# Patient Record
Sex: Female | Born: 1970 | Race: White | Hispanic: No | Smoking: Never smoker
Health system: Southern US, Community
[De-identification: ages and names within clinical notes are randomized; demographics above are authoritative.]

## PROBLEM LIST (undated history)

## (undated) DIAGNOSIS — D6851 Activated protein C resistance: Secondary | ICD-10-CM

## (undated) DIAGNOSIS — N289 Disorder of kidney and ureter, unspecified: Secondary | ICD-10-CM

## (undated) HISTORY — PX: LITHOTRIPSY: SUR834

---

## 2004-01-15 ENCOUNTER — Ambulatory Visit (HOSPITAL_COMMUNITY): Admission: RE | Admit: 2004-01-15 | Discharge: 2004-01-15 | Payer: Self-pay | Admitting: Emergency Medicine

## 2004-01-15 ENCOUNTER — Inpatient Hospital Stay (HOSPITAL_COMMUNITY): Admission: EM | Admit: 2004-01-15 | Discharge: 2004-01-17 | Payer: Self-pay | Admitting: Emergency Medicine

## 2004-01-20 ENCOUNTER — Encounter: Admission: RE | Admit: 2004-01-20 | Discharge: 2004-01-20 | Payer: Self-pay | Admitting: Family Medicine

## 2004-01-31 ENCOUNTER — Inpatient Hospital Stay (HOSPITAL_COMMUNITY): Admission: EM | Admit: 2004-01-31 | Discharge: 2004-02-07 | Payer: Self-pay | Admitting: Emergency Medicine

## 2004-04-24 ENCOUNTER — Encounter
Admission: RE | Admit: 2004-04-24 | Discharge: 2004-04-24 | Payer: Self-pay | Admitting: Thoracic Surgery (Cardiothoracic Vascular Surgery)

## 2004-08-21 ENCOUNTER — Ambulatory Visit: Payer: Self-pay | Admitting: Cardiology

## 2005-06-07 IMAGING — CR DG CHEST 2V
2 series · 2 of 2 positions shown · non-contrast
Comparison: none

CLINICAL DATA: Acute pericardial effusion.  Chest pain.   Preoperative respiratory exam. 
 TWO VIEW CHEST   
 Moderate cardiac enlargement is seen.  Small pleural effusions are present bilaterally as well as mild bibasilar atelectasis.  There is no evidence of pulmonary edema.  There is no evidence of hilar or mediastinal mass.
 IMPRESSION
 1.  Moderate cardiac enlargement. 
 2.  Small bilateral pleural effusions.
 3.  Mild bibasilar atelectasis.

[view not recorded (1 of 2)]
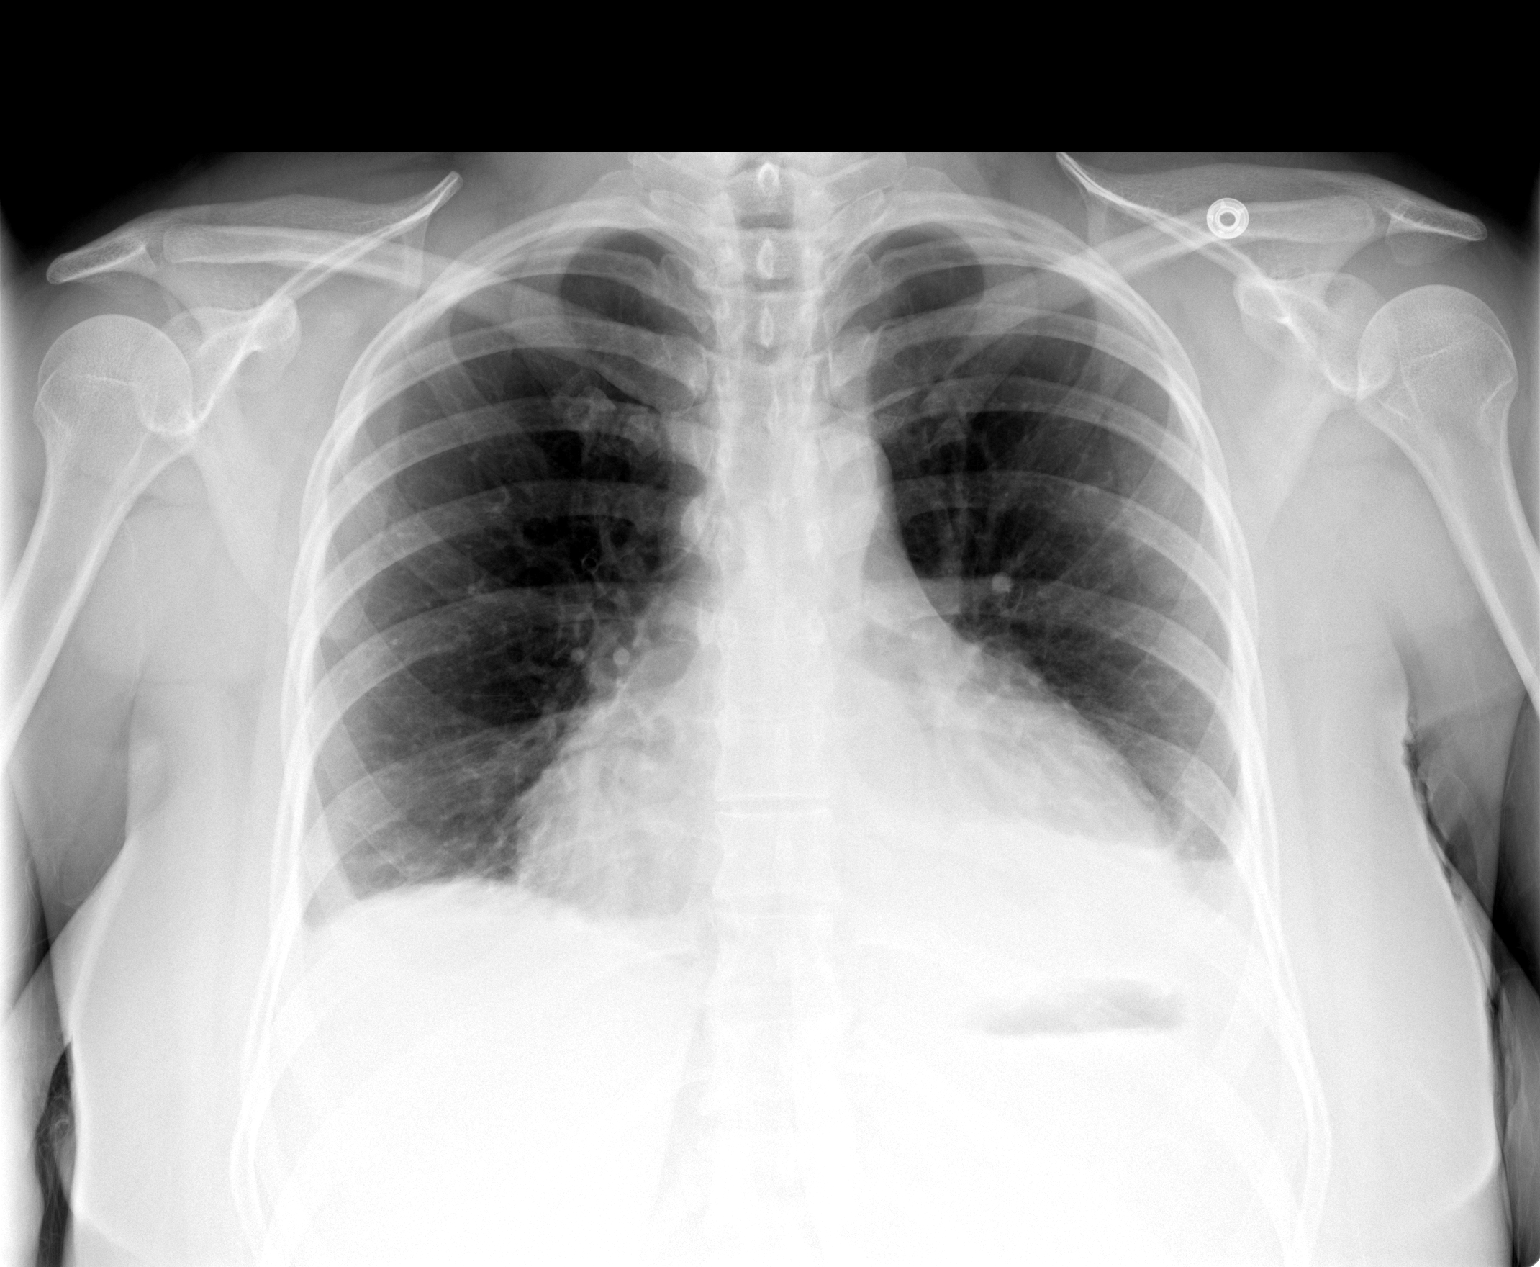

[view not recorded (2 of 2)]
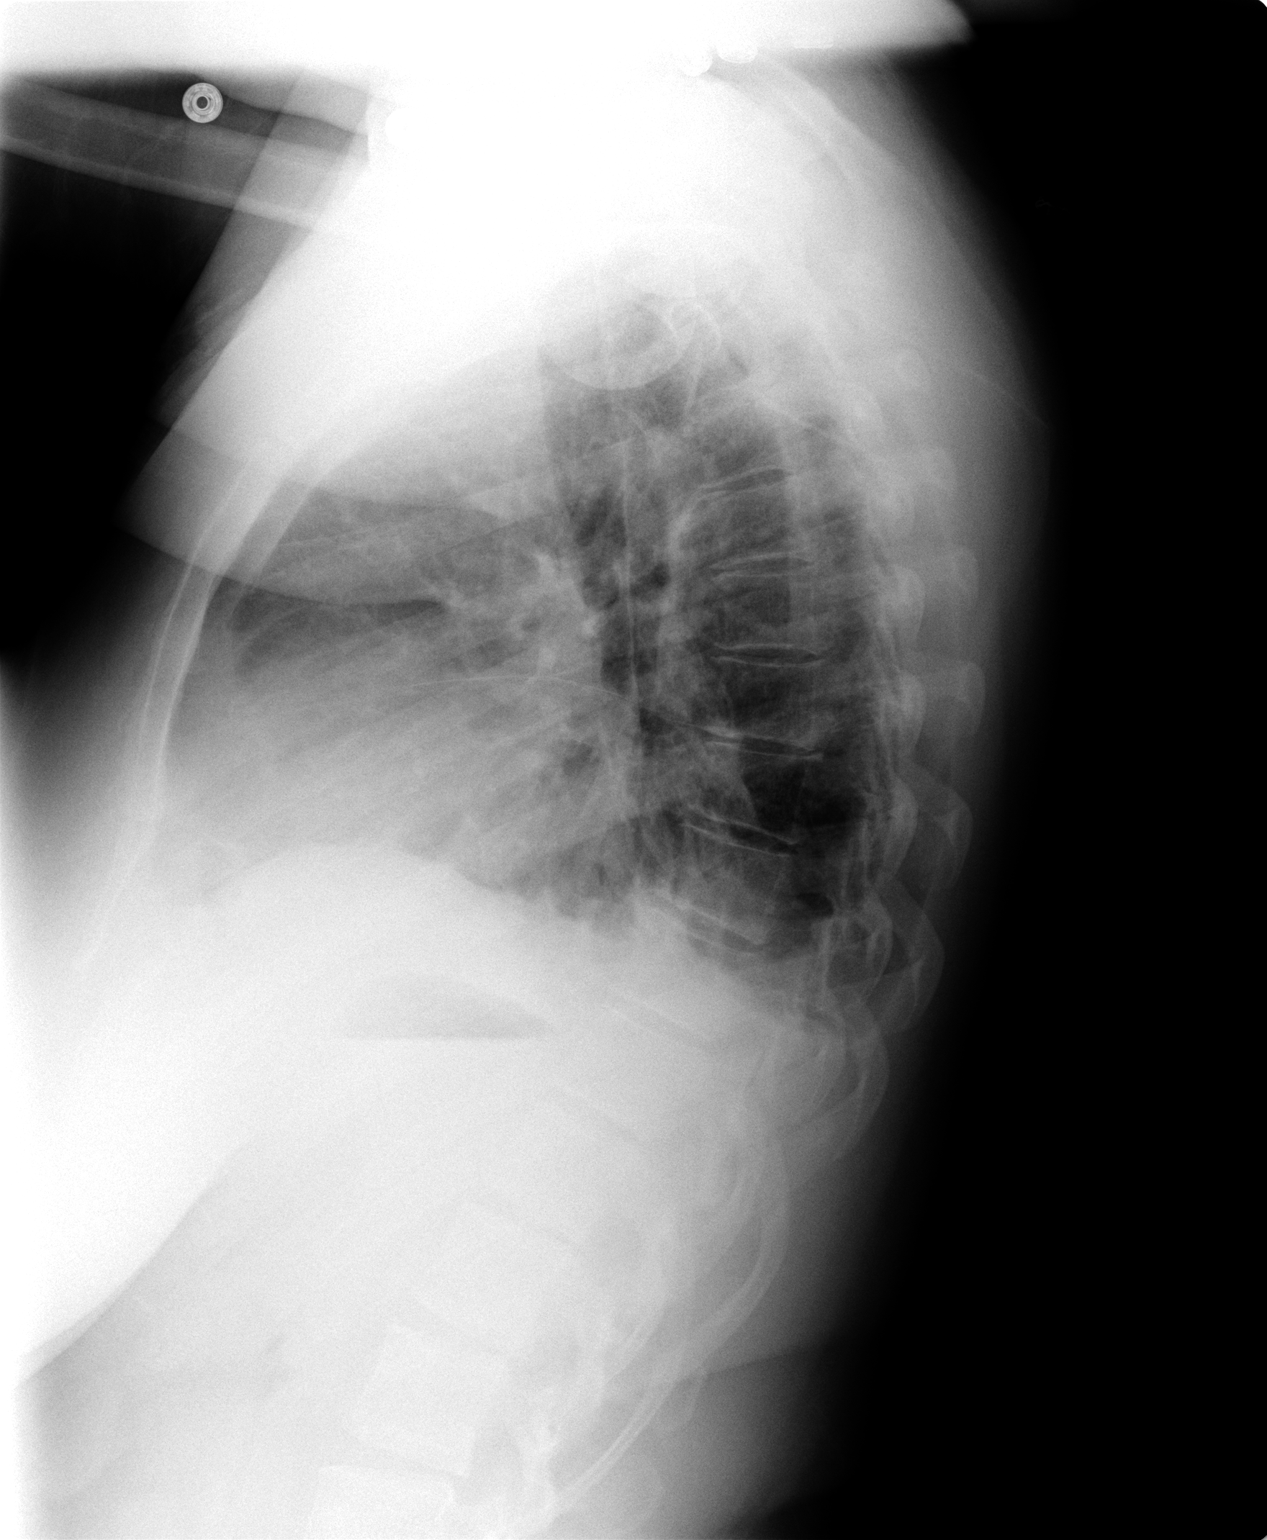

[2 of 2 positions shown; findings below may reference images not displayed]

## 2005-06-08 IMAGING — CR DG CHEST 1V PORT
1 series · 1 of 1 positions shown · non-contrast
Comparison: 02/01/04.

CLINICAL DATA: 32-year-old female pericardial effusion status-post subxiphoid pericardial window.  
PORTABLE SINGLE VIEW CHEST RADIOGRAPH

[view not recorded]
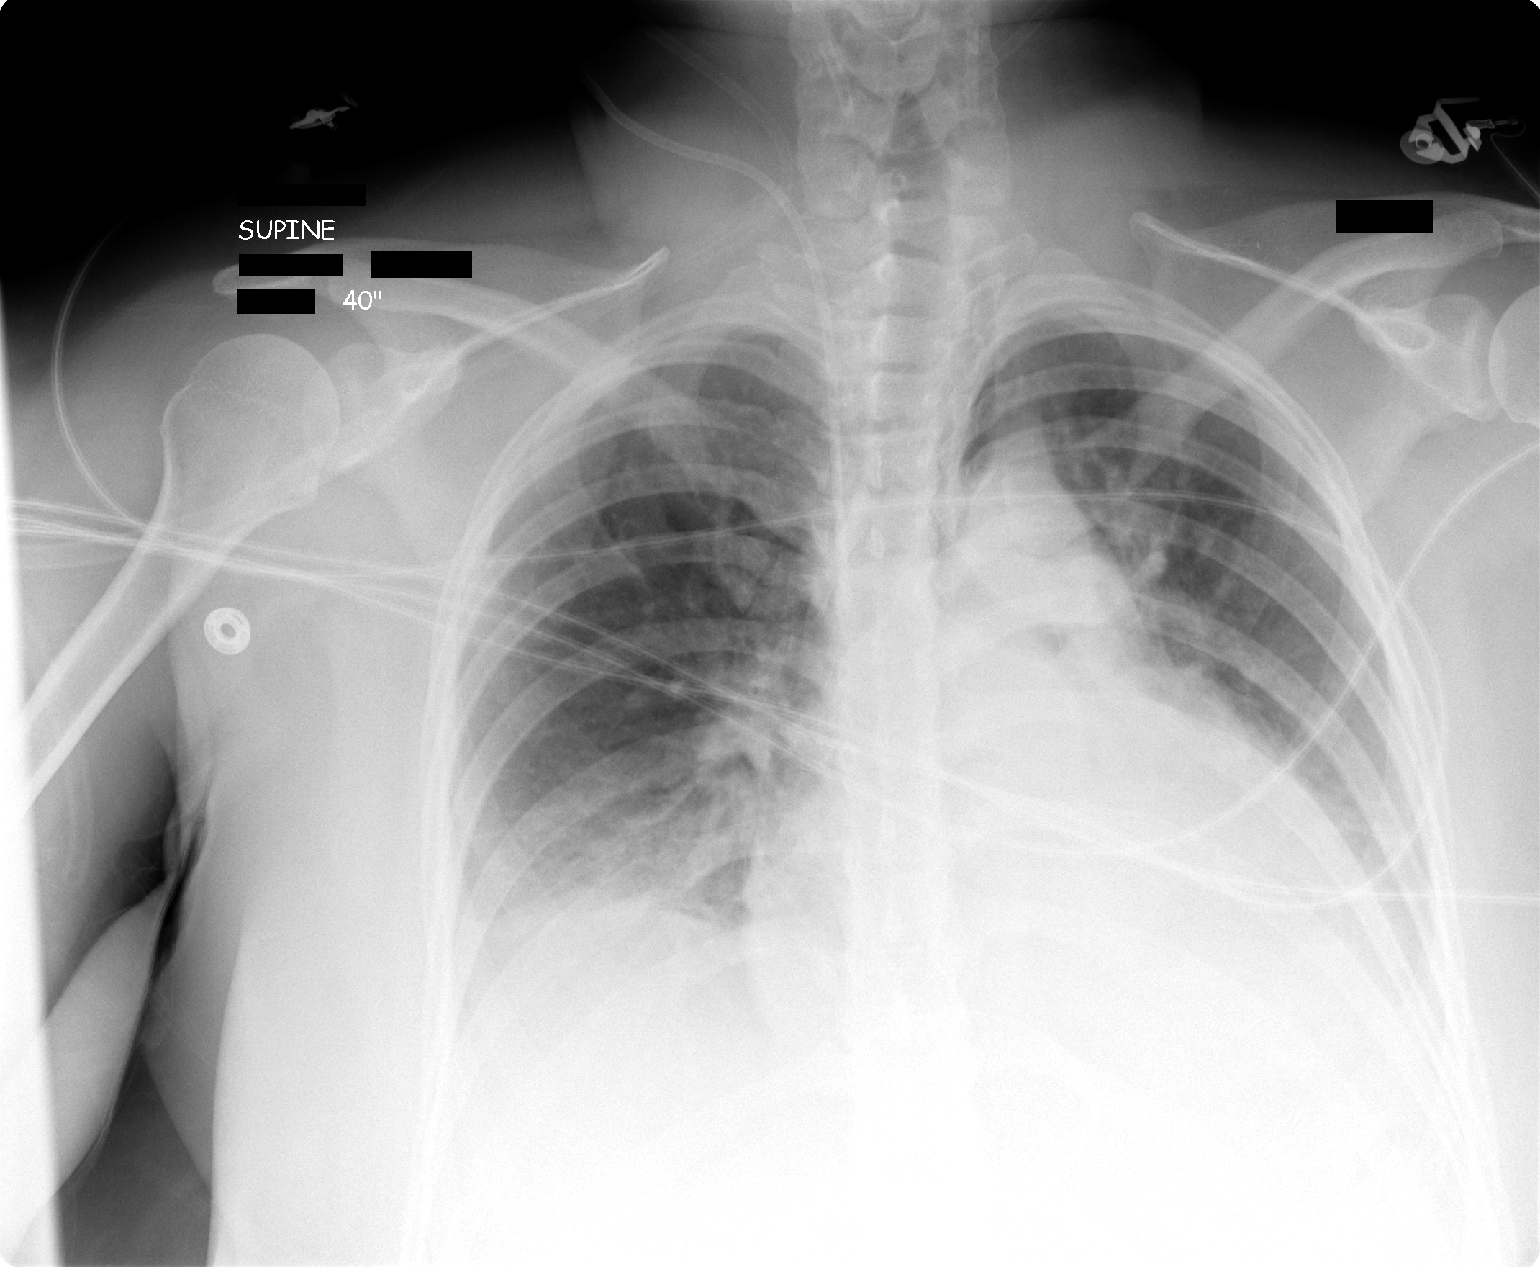

[1 of 1 positions shown; findings below may reference images not displayed]

FINDINGS: Right IJ central line tip is in the proximal SVC.  No pneumothorax.  The cardiac silhouette is enlarged.  There is a drainage catheter projected over the subxiphoid region related to the pericardial procedure.  Residual pericardial fluid is suspected.  There is bibasilar air space disease/atelectasis, left greater than right.  A component of left pleural fluid cannot be excluded.  Along the left mediastinum there is a curvilinear lucency noted which could represent pneumomediastinum related to the procedure. 
IMPRESSION
Status-post subxiphoid pericardial window with residual enlargement of the pericardial silhouette.  Remaining pericardial fluid is suspected. 
Curvilinear lucency along the left aspect of the mediastinum suggestive of pneumomediastinum. 
Right IJ central line tip in the proximal SVC.  
No definite pneumothorax. 
Left greater than right bibasilar air space disease and/or consolidation.

## 2007-12-15 ENCOUNTER — Ambulatory Visit: Payer: Self-pay | Admitting: Internal Medicine

## 2010-11-04 ENCOUNTER — Encounter: Payer: Self-pay | Admitting: Thoracic Surgery (Cardiothoracic Vascular Surgery)

## 2017-09-03 ENCOUNTER — Other Ambulatory Visit: Payer: Self-pay

## 2017-09-03 ENCOUNTER — Encounter (HOSPITAL_BASED_OUTPATIENT_CLINIC_OR_DEPARTMENT_OTHER): Payer: Self-pay

## 2017-09-03 ENCOUNTER — Emergency Department (HOSPITAL_BASED_OUTPATIENT_CLINIC_OR_DEPARTMENT_OTHER)
Admission: EM | Admit: 2017-09-03 | Discharge: 2017-09-03 | Disposition: A | Discharge status: Home / Self Care | Payer: Medicaid Other | Attending: Emergency Medicine | Admitting: Emergency Medicine

## 2017-09-03 DIAGNOSIS — J020 Streptococcal pharyngitis: Secondary | ICD-10-CM | POA: Diagnosis not present

## 2017-09-03 DIAGNOSIS — J029 Acute pharyngitis, unspecified: Secondary | ICD-10-CM | POA: Diagnosis present

## 2017-09-03 HISTORY — DX: Disorder of kidney and ureter, unspecified: N28.9

## 2017-09-03 HISTORY — DX: Activated protein C resistance: D68.51

## 2017-09-03 LAB — RAPID STREP SCREEN (MED CTR MEBANE ONLY): STREPTOCOCCUS, GROUP A SCREEN (DIRECT): POSITIVE — AB

## 2017-09-03 MED ORDER — DEXAMETHASONE SODIUM PHOSPHATE 10 MG/ML IJ SOLN
10.0000 mg | Freq: Once | INTRAMUSCULAR | Status: AC
  Administered 2017-09-03: 10 mg via INTRAMUSCULAR
  Filled 2017-09-03: qty 1

## 2017-09-03 MED ORDER — PENICILLIN G BENZATHINE 1200000 UNIT/2ML IM SUSP
1.2000 10*6.[IU] | Freq: Once | INTRAMUSCULAR | Status: AC
  Administered 2017-09-03: 1.2 10*6.[IU] via INTRAMUSCULAR
  Filled 2017-09-03: qty 2

## 2017-09-03 NOTE — ED Provider Notes (Signed)
[facility] EMERGENCY DEPARTMENT Provider Note   CSN: [id number] Arrival date & time: 09/03/17  1021     History   Chief Complaint Chief Complaint  Patient presents with  . Sore Throat    HPI Carmen Chase is a 46 y.o. female who presents today with chief complaint acute onset, progressively worsening sore throat since last night.  Pain is constant and sharp, worsens with swallowing.  She woke up this morning with subjective fever and took ibuprofen with improvement.  Has also had 5-6 days of left-sided nasal congestion with improvement with use of over-the-counter sinus and cold medication.  Denies headaches, neck pain, nausea, vomiting, ear pain, chest pain, or shortness of breath.  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  She has been able to tolerate food and drink without difficulty.  The history is provided by the patient.    Past Medical History:  Diagnosis Date  . Factor 5 Leiden mutation, heterozygous (HCC)   . Renal disorder     There are no active problems to display for this patient.   Past Surgical History:  Procedure Laterality Date  . LITHOTRIPSY      OB History    No data available       Home Medications    Prior to Admission medications   Not on File    Family History No family history on file.  Social History Social History   Tobacco Use  . Smoking status: Never Smoker  . Smokeless tobacco: Never Used  Substance Use Topics  . Alcohol use: Yes    Comment: occ  . Drug use: Not on file     Allergies   Patient has no known allergies.   Review of Systems Review of Systems  Constitutional: Positive for fever.  HENT: Positive for congestion and sore throat.   Eyes: Negative for visual disturbance.  Respiratory: Negative for shortness of breath.   Cardiovascular: Negative for chest pain.  Neurological: Negative for headaches.  All other systems reviewed and are  negative.    Physical Exam Updated Vital Signs BP (!) 146/88 (BP Location: Left Arm)   Pulse (!) 109   Temp 98.4 F (36.9 C) (Oral)   Resp 18   Ht 5\' 3"  (1.6 m)   Wt 104.3 kg (230 lb)   LMP 08/29/2017 (Exact Date)   SpO2 99%   BMI 40.74 kg/m   Physical Exam  Constitutional: She appears well-developed and well-nourished. No distress.  HENT:  Head: Normocephalic and atraumatic.  Right Ear: Tympanic membrane and ear canal normal. No middle ear effusion.  Left Ear: Tympanic membrane and ear canal normal.  No middle ear effusion.  Mouth/Throat: Uvula is midline and mucous membranes are normal. No oropharyngeal exudate. Tonsils are 2+ on the right. Tonsils are 2+ on the left. No tonsillar exudate.  Left maxillary sinus mildly TTP.  No frontal sinus TTP.  Nasal septum is midline with pink mucosa and nasal congestion bilaterally.  Posterior oropharynx with bilateral tonsillar hypertrophy and erythema, no uvular deviation or tonsillar exudates.  No trismus or sublingual abnormalities.  Eyes: Conjunctivae are normal. Pupils are equal, round, and reactive to light. Right eye exhibits no discharge. Left eye exhibits no discharge.  Neck: Normal range of motion. Neck supple. No JVD present. No tracheal deviation present.  No meningeal signs, left anterior cervical lymphadenopathy  Cardiovascular: Normal rate and intact distal pulses.  Pulmonary/Chest: Effort normal and breath sounds  normal.  Abdominal: Soft. She exhibits no distension.  Musculoskeletal: She exhibits no edema.  Lymphadenopathy:    She has cervical adenopathy.  Neurological: She is alert.  Skin: Skin is warm and dry. No erythema.  Psychiatric: She has a normal mood and affect. Her behavior is normal.  Nursing note and vitals reviewed.    ED Treatments / Results  Labs (all labs ordered are listed, but only abnormal results are displayed) Labs Reviewed  RAPID STREP SCREEN (NOT AT [facility]) - Abnormal; Notable for the  following components:      Result Value   Streptococcus, Group A Screen (Direct) POSITIVE (*)    All other components within normal limits    EKG  EKG Interpretation None       Radiology No results found.  Procedures Procedures (including critical care time)  Medications Ordered in ED Medications  dexamethasone (DECADRON) injection 10 mg (not administered)  penicillin g benzathine (BICILLIN LA) 1200000 UNIT/2ML injection 1.2 Million Units (not administered)     Initial Impression / Assessment and Plan / ED Course  I have reviewed the triage vital signs and the nursing notes.  Pertinent labs & imaging results that were available during my care of the patient were reviewed by me and considered in my medical decision making (see chart for details).     Pt febrile at home with cervical lymphadenopathy, & dysphagia; rapid strep positive; diagnosis of strep. Treated in the ED with steroids and PCN IM.  Pt appears mildly dehydrated, discussed importance of water rehydration. Presentation non concerning for PTA or infxn spread to soft tissue. No trismus or uvula deviation. Specific return precautions discussed. Pt able to drink water in ED without difficulty with intact air way. Recommended PCP follow up. Pt verbalized understanding of and agreement with plan and is safe for discharge home at this time.   Final Clinical Impressions(s) / ED Diagnoses   Final diagnoses:  Strep pharyngitis    ED Discharge Orders    None       Jeanie SewerFawze, Lance Galas A, PA-C 09/03/17 1108    Charlynne PanderYao, David Hsienta, MD 09/04/17 1000

## 2017-09-03 NOTE — Discharge Instructions (Signed)
Alternate 600 mg of ibuprofen and [phone] mg of Tylenol every 3 hours as needed for pain/fever. Do not exceed 4000 mg of Tylenol daily.  Warm water salt gargles and warm drinks for comfort.  You may also use throat lozenges for comfort.  Follow-up with primary care physician if symptoms persist.  Return to the ED immediately for any concerning signs or drooling, throat tightness or difficulty swallowing, neck stiffness, or worsening pain.

## 2017-09-03 NOTE — ED Triage Notes (Signed)
Sore throat since last night. Fever this morning. 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
